# Patient Record
Sex: Female | Born: 1970 | Race: White | Hispanic: No | Marital: Married | Smoking: Never smoker
Health system: Southern US, Community
[De-identification: ages and names within clinical notes are randomized; demographics above are authoritative.]

## PROBLEM LIST (undated history)

## (undated) DIAGNOSIS — G8929 Other chronic pain: Secondary | ICD-10-CM

## (undated) DIAGNOSIS — S0300XA Dislocation of jaw, unspecified side, initial encounter: Secondary | ICD-10-CM

## (undated) DIAGNOSIS — M797 Fibromyalgia: Secondary | ICD-10-CM

## (undated) DIAGNOSIS — M542 Cervicalgia: Secondary | ICD-10-CM

## (undated) HISTORY — PX: TMJ ARTHROPLASTY: SHX1066

---

## 1999-08-30 ENCOUNTER — Other Ambulatory Visit: Admission: RE | Admit: 1999-08-30 | Discharge: 1999-08-30 | Payer: Self-pay | Admitting: Obstetrics & Gynecology

## 1999-09-06 ENCOUNTER — Encounter: Payer: Self-pay | Admitting: Obstetrics & Gynecology

## 1999-09-06 ENCOUNTER — Encounter: Admission: RE | Admit: 1999-09-06 | Discharge: 1999-09-06 | Payer: Self-pay | Admitting: Obstetrics & Gynecology

## 2000-09-04 ENCOUNTER — Other Ambulatory Visit: Admission: RE | Admit: 2000-09-04 | Discharge: 2000-09-04 | Payer: Self-pay | Admitting: Obstetrics & Gynecology

## 2002-07-21 ENCOUNTER — Other Ambulatory Visit: Admission: RE | Admit: 2002-07-21 | Discharge: 2002-07-21 | Payer: Self-pay | Admitting: Obstetrics & Gynecology

## 2015-01-06 ENCOUNTER — Other Ambulatory Visit: Payer: Self-pay | Admitting: Neurology

## 2015-01-06 DIAGNOSIS — G4453 Primary thunderclap headache: Secondary | ICD-10-CM

## 2015-01-06 DIAGNOSIS — R51 Headache: Principal | ICD-10-CM

## 2015-01-06 DIAGNOSIS — R519 Headache, unspecified: Secondary | ICD-10-CM

## 2015-01-17 ENCOUNTER — Ambulatory Visit
Admission: RE | Admit: 2015-01-17 | Discharge: 2015-01-17 | Disposition: A | Discharge status: Home / Self Care | Payer: BLUE CROSS/BLUE SHIELD | Source: Ambulatory Visit | Attending: Neurology | Admitting: Neurology

## 2015-01-17 DIAGNOSIS — R519 Headache, unspecified: Secondary | ICD-10-CM

## 2015-01-17 DIAGNOSIS — R51 Headache: Principal | ICD-10-CM

## 2015-01-17 DIAGNOSIS — G4453 Primary thunderclap headache: Secondary | ICD-10-CM

## 2015-01-17 MED ORDER — GADOBENATE DIMEGLUMINE 529 MG/ML IV SOLN
7.0000 mL | Freq: Once | INTRAVENOUS | Status: AC | PRN
  Administered 2015-01-17: 7 mL via INTRAVENOUS

## 2015-01-21 ENCOUNTER — Other Ambulatory Visit: Payer: Self-pay | Admitting: Neurology

## 2015-01-25 ENCOUNTER — Other Ambulatory Visit: Payer: Self-pay | Admitting: Neurology

## 2015-01-25 DIAGNOSIS — R519 Headache, unspecified: Secondary | ICD-10-CM

## 2015-01-25 DIAGNOSIS — R51 Headache: Principal | ICD-10-CM

## 2015-01-25 DIAGNOSIS — R42 Dizziness and giddiness: Secondary | ICD-10-CM

## 2015-01-28 ENCOUNTER — Ambulatory Visit
Admission: RE | Admit: 2015-01-28 | Discharge: 2015-01-28 | Disposition: A | Discharge status: Home / Self Care | Payer: BLUE CROSS/BLUE SHIELD | Source: Ambulatory Visit | Attending: Neurology | Admitting: Neurology

## 2015-01-28 DIAGNOSIS — R519 Headache, unspecified: Secondary | ICD-10-CM

## 2015-01-28 DIAGNOSIS — R51 Headache: Principal | ICD-10-CM

## 2015-01-28 MED ORDER — IOPAMIDOL (ISOVUE-370) INJECTION 76%
100.0000 mL | Freq: Once | INTRAVENOUS | Status: AC | PRN
  Administered 2015-01-28: 100 mL via INTRAVENOUS

## 2017-06-10 ENCOUNTER — Emergency Department (HOSPITAL_BASED_OUTPATIENT_CLINIC_OR_DEPARTMENT_OTHER): Payer: BLUE CROSS/BLUE SHIELD

## 2017-06-10 ENCOUNTER — Other Ambulatory Visit: Payer: Self-pay

## 2017-06-10 ENCOUNTER — Emergency Department (HOSPITAL_COMMUNITY): Payer: BLUE CROSS/BLUE SHIELD

## 2017-06-10 ENCOUNTER — Emergency Department (HOSPITAL_BASED_OUTPATIENT_CLINIC_OR_DEPARTMENT_OTHER)
Admission: EM | Admit: 2017-06-10 | Discharge: 2017-06-10 | Disposition: A | Discharge status: Home / Self Care | Payer: BLUE CROSS/BLUE SHIELD | Attending: Emergency Medicine | Admitting: Emergency Medicine

## 2017-06-10 ENCOUNTER — Encounter (HOSPITAL_BASED_OUTPATIENT_CLINIC_OR_DEPARTMENT_OTHER): Payer: Self-pay | Admitting: Emergency Medicine

## 2017-06-10 DIAGNOSIS — H538 Other visual disturbances: Secondary | ICD-10-CM | POA: Diagnosis not present

## 2017-06-10 DIAGNOSIS — Z79899 Other long term (current) drug therapy: Secondary | ICD-10-CM | POA: Diagnosis not present

## 2017-06-10 DIAGNOSIS — R42 Dizziness and giddiness: Secondary | ICD-10-CM | POA: Diagnosis present

## 2017-06-10 DIAGNOSIS — M542 Cervicalgia: Secondary | ICD-10-CM | POA: Diagnosis not present

## 2017-06-10 DIAGNOSIS — R51 Headache: Secondary | ICD-10-CM | POA: Diagnosis not present

## 2017-06-10 HISTORY — DX: Other chronic pain: G89.29

## 2017-06-10 HISTORY — DX: Dislocation of jaw, unspecified side, initial encounter: S03.00XA

## 2017-06-10 HISTORY — DX: Fibromyalgia: M79.7

## 2017-06-10 HISTORY — DX: Cervicalgia: M54.2

## 2017-06-10 LAB — CBC WITH DIFFERENTIAL/PLATELET
BASOS PCT: 1 %
Basophils Absolute: 0 10*3/uL (ref 0.0–0.1)
EOS ABS: 0.1 10*3/uL (ref 0.0–0.7)
EOS PCT: 2 %
HCT: 44.6 % (ref 36.0–46.0)
Hemoglobin: 14.9 g/dL (ref 12.0–15.0)
LYMPHS ABS: 2.8 10*3/uL (ref 0.7–4.0)
Lymphocytes Relative: 31 %
MCH: 28.4 pg (ref 26.0–34.0)
MCHC: 33.4 g/dL (ref 30.0–36.0)
MCV: 85.1 fL (ref 78.0–100.0)
MONOS PCT: 9 %
Monocytes Absolute: 0.8 10*3/uL (ref 0.1–1.0)
Neutro Abs: 5.1 10*3/uL (ref 1.7–7.7)
Neutrophils Relative %: 57 %
PLATELETS: 327 10*3/uL (ref 150–400)
RBC: 5.24 MIL/uL — ABNORMAL HIGH (ref 3.87–5.11)
RDW: 13.1 % (ref 11.5–15.5)
WBC: 8.9 10*3/uL (ref 4.0–10.5)

## 2017-06-10 LAB — BASIC METABOLIC PANEL
Anion gap: 5 (ref 5–15)
BUN: 9 mg/dL (ref 6–20)
CALCIUM: 9.1 mg/dL (ref 8.9–10.3)
CHLORIDE: 106 mmol/L (ref 101–111)
CO2: 28 mmol/L (ref 22–32)
CREATININE: 0.71 mg/dL (ref 0.44–1.00)
GFR calc non Af Amer: >60 mL/min (ref >60)
Glucose, Bld: 93 mg/dL (ref 65–99)
Potassium: 4.6 mmol/L (ref 3.5–5.1)
Sodium: 139 mmol/L (ref 135–145)

## 2017-06-10 MED ORDER — DIAZEPAM 5 MG PO TABS
5.0000 mg | ORAL_TABLET | Freq: Once | ORAL | Status: AC
  Administered 2017-06-10: 5 mg via ORAL
  Filled 2017-06-10: qty 1

## 2017-06-10 MED ORDER — MECLIZINE HCL 25 MG PO TABS
25.0000 mg | ORAL_TABLET | Freq: Once | ORAL | Status: AC
  Administered 2017-06-10: 25 mg via ORAL
  Filled 2017-06-10: qty 1

## 2017-06-10 MED ORDER — IOPAMIDOL (ISOVUE-370) INJECTION 76%
100.0000 mL | Freq: Once | INTRAVENOUS | Status: AC | PRN
  Administered 2017-06-10: 100 mL via INTRAVENOUS

## 2017-06-10 NOTE — ED Provider Notes (Signed)
Pt received in transfer [facility]. Pt in stable condition at the time of arrival w/continued dizziness despite meclizine & valium PTA. Normal neurological examination.  Neurology consulted by phone & recommended MRI brain w/o.  UPDATE: MRI w/o intracranial abnormalities.  Explained all results to the Pt & husband. Will discharge the Pt home. Recommending follow-up with Neurology. ED return precautions provided. Pt acknowledged understanding of, and concurrence with the plan. All questions answered to her satisfaction. In stable condition at the time of discharge.   Forest BeckerPetit, Amela Handley, MD 06/10/17 1925    Gerhard MunchLockwood, Robert, MD 06/11/17 0030

## 2017-06-10 NOTE — ED Triage Notes (Signed)
intermittent dizziness, blurry vision and double vision, intermittent since christmas, constant today.  some N/V but none now.

## 2017-06-10 NOTE — ED Notes (Signed)
Patient transported to MRI 

## 2017-06-10 NOTE — ED Notes (Signed)
[facility]

## 2017-06-10 NOTE — ED Notes (Signed)
Report to Shanda BumpsJessica, Consulting civil [facility]charge RN [facility].

## 2017-06-10 NOTE — ED Notes (Signed)
ED Provider at bedside. 

## 2017-06-10 NOTE — ED Provider Notes (Signed)
[facility] EMERGENCY DEPARTMENT Provider Note   CSN: [id number] Arrival date & time: 06/10/17  0836  History   Chief Complaint Chief Complaint  Patient presents with  . Dizziness    HPI Julie Hurst is a 47 y.o. female who presents to the ED complaining of intermittent dizziness since 12/25.  Patient states that she woke up on Christmas and felt dizzy like the room was spinning, typically spinning to the left.  States she got out of bed which acutely worsened the dizziness leading to an episode of vomiting.  States that the dizziness then persisted for 3-4 hours.  Since christmas she has had 5-6 days with similar symptoms including this morning. Dizziness is always in the morning and always worse with movement.  .  Will also have associated lightheadedness at times.  Tried Dramamine a few days ago without any change, has not tried since. She is also having constant pain at the base of the neck since christmas, worse with movement, no injury. Describes the pain as burning. States she has hx of somewhat similar that was much milder- two less severe episodes in summer of 2018- was informed it was an equilibrium issue and given epley maneuver exercises. Also of note having blurry vision in bilateral eyes, L > R, for the past 4 months that is constant, worsened with dizziness. Denies numbness or weakness. Has had chronic unchanged headaches, none at present.   HPI  Past Medical History:  Diagnosis Date  . Chronic neck pain   . Fibromyalgia   . TMJ (dislocation of temporomandibular joint)     There are no active problems to display for this patient.   Past Surgical History:  Procedure Laterality Date  . TMJ ARTHROPLASTY      OB History    No data available       Home Medications    Prior to Admission medications   Medication Sig Start Date End Date Taking? Authorizing Provider  cholecalciferol (VITAMIN D) 1000 units tablet Take 2,000 Units by mouth daily.   Yes  [provider]  venlafaxine XR (EFFEXOR-XR) 75 MG 24 hr capsule Take 75 mg by mouth daily with breakfast.   Yes [provider]    Family History History reviewed. No pertinent family history.  Social History Social History   Tobacco Use  . Smoking status: Not on file  Substance Use Topics  . Alcohol use: Not on file  . Drug use: Not on file     Allergies   Patient has no known allergies.   Review of Systems Review of Systems  Constitutional: Negative for fever.  HENT: Positive for tinnitus (intermittent, chronic unchanged ). Negative for congestion, ear pain, hearing loss and rhinorrhea.   Eyes: Positive for visual disturbance (constant x4 months. ).  Respiratory: Negative for shortness of breath.   Cardiovascular: Negative for palpitations.  Gastrointestinal: Positive for nausea and vomiting (not today). Negative for abdominal pain, constipation and diarrhea.  Genitourinary: Negative for dysuria.  Musculoskeletal: Positive for neck pain.  Neurological: Positive for dizziness, light-headedness and headaches (chronic unchanged). Negative for weakness and numbness.  All other systems reviewed and are negative.    Physical Exam Updated Vital Signs BP (!) 141/102 (BP Location: Left Arm)   Pulse 100   Temp 98.4 F (36.9 C) (Oral)   Resp 14   Ht 5\' 4"  (1.626 m)   Wt 70.3 kg (155 lb)   LMP 05/30/2017   SpO2 99%   BMI 26.61 kg/m  Physical Exam  Constitutional: She appears well-developed and well-nourished. No distress.  HENT:  Head: Normocephalic and atraumatic.  Mouth/Throat: Uvula is midline and oropharynx is clear and moist.  Eyes: Conjunctivae and EOM are normal. Pupils are equal, round, and reactive to light. Right eye exhibits no discharge. Left eye exhibits no discharge. Right eye exhibits no nystagmus. Left eye exhibits no nystagmus.  Neck: Spinous process tenderness (diffuse) present. No muscular tenderness present.  Cardiovascular:  Normal rate and regular rhythm.  No murmur heard. Pulmonary/Chest: Breath sounds normal. No respiratory distress. She has no wheezes. She has no rales.  Abdominal: Soft. She exhibits no distension. There is no tenderness.  Neurological: She is alert.  Clear speech. No facial droop. CNIII-XII are intact. Bilateral upper and lower extremities' sensation intact to sharp and dull touch. 5/5 grip strength bilaterally. 5/5 plantar and dorsi flexion bilaterally. Gait intact. Negative pronator drift. Negative HINTS exam.   Skin: Skin is warm and dry. No rash noted.  Psychiatric: She has a normal mood and affect. Her behavior is normal.  Nursing note and vitals reviewed.   ED Treatments / Results  Labs Results for orders placed or performed during the hospital encounter of 06/10/17  Basic metabolic panel  Result Value Ref Range   Sodium 139 135 - 145 mmol/L   Potassium 4.6 3.5 - 5.1 mmol/L   Chloride 106 101 - 111 mmol/L   CO2 28 22 - 32 mmol/L   Glucose, Bld 93 65 - 99 mg/dL   BUN 9 6 - 20 mg/dL   Creatinine, Ser 1.61 0.44 - 1.00 mg/dL   Calcium 9.1 8.9 - 09.6 mg/dL   GFR calc non Af Amer >60 >60 mL/min   GFR calc Af Amer >60 >60 mL/min   Anion gap 5 5 - 15  CBC with Differential  Result Value Ref Range   WBC 8.9 4.0 - 10.5 K/uL   RBC 5.24 (H) 3.87 - 5.11 MIL/uL   Hemoglobin 14.9 12.0 - 15.0 g/dL   HCT 04.5 40.9 - 81.1 %   MCV 85.1 78.0 - 100.0 fL   MCH 28.4 26.0 - 34.0 pg   MCHC 33.4 30.0 - 36.0 g/dL   RDW 91.4 78.2 - 95.6 %   Platelets 327 150 - 400 K/uL   Neutrophils Relative % 57 %   Neutro Abs 5.1 1.7 - 7.7 K/uL   Lymphocytes Relative 31 %   Lymphs Abs 2.8 0.7 - 4.0 K/uL   Monocytes Relative 9 %   Monocytes Absolute 0.8 0.1 - 1.0 K/uL   Eosinophils Relative 2 %   Eosinophils Absolute 0.1 0.0 - 0.7 K/uL   Basophils Relative 1 %   Basophils Absolute 0.0 0.0 - 0.1 K/uL    EKG  EKG Interpretation None      Radiology Ct Angio Head W Or Wo Contrast  Result Date:  06/10/2017 CLINICAL DATA:  Vertigo. Intermittent dizziness, blurred vision, and diplopia since 05/28/2017, worse today. Posterior neck pain. EXAM: CT ANGIOGRAPHY HEAD AND NECK TECHNIQUE: Multidetector CT imaging of the head and neck was performed using the standard protocol during bolus administration of intravenous contrast. Multiplanar CT image reconstructions and MIPs were obtained to evaluate the vascular anatomy. Carotid stenosis measurements (when applicable) are obtained utilizing NASCET criteria, using the distal internal carotid diameter as the denominator. CONTRAST:  ISOVUE-370 IOPAMIDOL (ISOVUE-370) INJECTION 76% COMPARISON:  Head CT 01/28/2015.  Brain MRI 01/17/2015. FINDINGS: CT HEAD FINDINGS Brain: There is no evidence of acute infarct, intracranial hemorrhage,  mass, midline shift, or extra-axial fluid collection. The ventricles and sulci are normal. Vascular: No hyperdense vessel. Skull: No fracture or focal osseous lesion. Sinuses: Visualized paranasal sinuses and mastoid air cells are clear. Orbits: Unremarkable. Review of the MIP images confirms the above findings CTA NECK FINDINGS Aortic arch: Normal variant aortic arch branching with common origin of the brachiocephalic and left common carotid arteries. Widely patent arch vessel origins. Right carotid system: Patent without evidence of dissection, stenosis, or significant atherosclerosis. Left carotid system: Patent without evidence of dissection, stenosis, or significant atherosclerosis. Vertebral arteries: Patent and codominant without evidence of dissection, stenosis, or significant atherosclerosis. Skeleton: Mild cervical spondylosis. Other neck: No mass or lymph node enlargement. Upper chest: Clear lung apices. Review of the MIP images confirms the above findings CTA HEAD FINDINGS Anterior circulation: The internal carotid arteries are widely patent from skullbase to carotid termini. A 1.5 mm outpouching from the left supraclinoid ICA  is unchanged from the prior CTA. A small vessel courses passed this outpouching but appears to be a vein. A left posterior communicating artery is not clearly identified on this study. ACAs and MCAs are patent without evidence of proximal branch occlusion or significant proximal stenosis. Posterior circulation: The intracranial vertebral arteries are widely patent to the basilar. Patent PICA and SCA origins are visualized bilaterally. The basilar artery is widely patent. There is a small right posterior communicating artery. The PCAs are patent without evidence of significant stenosis. No aneurysm. Venous sinuses: Patent. Anatomic variants: None. Delayed phase: No abnormal enhancement. Review of the MIP images confirms the above findings IMPRESSION: 1. Unchanged 1.5 mm outpouching from the left supraclinoid ICA which may represent an infundibulum or tiny aneurysm. 2. Otherwise negative head and neck CTA. Electronically Signed   By: Sebastian AcheAllen  Grady M.D.   On: 06/10/2017 12:15   Ct Angio Neck W And/or Wo Contrast  Result Date: 06/10/2017 CLINICAL DATA:  Vertigo. Intermittent dizziness, blurred vision, and diplopia since 05/28/2017, worse today. Posterior neck pain. EXAM: CT ANGIOGRAPHY HEAD AND NECK TECHNIQUE: Multidetector CT imaging of the head and neck was performed using the standard protocol during bolus administration of intravenous contrast. Multiplanar CT image reconstructions and MIPs were obtained to evaluate the vascular anatomy. Carotid stenosis measurements (when applicable) are obtained utilizing NASCET criteria, using the distal internal carotid diameter as the denominator. CONTRAST:  100mL ISOVUE-370 IOPAMIDOL (ISOVUE-370) INJECTION 76% COMPARISON:  Head CT 01/28/2015.  Brain MRI 01/17/2015. FINDINGS: CT HEAD FINDINGS Brain: There is no evidence of acute infarct, intracranial hemorrhage, mass, midline shift, or extra-axial fluid collection. The ventricles and sulci are normal. Vascular: No hyperdense  vessel. Skull: No fracture or focal osseous lesion. Sinuses: Visualized paranasal sinuses and mastoid air cells are clear. Orbits: Unremarkable. Review of the MIP images confirms the above findings CTA NECK FINDINGS Aortic arch: Normal variant aortic arch branching with common origin of the brachiocephalic and left common carotid arteries. Widely patent arch vessel origins. Right carotid system: Patent without evidence of dissection, stenosis, or significant atherosclerosis. Left carotid system: Patent without evidence of dissection, stenosis, or significant atherosclerosis. Vertebral arteries: Patent and codominant without evidence of dissection, stenosis, or significant atherosclerosis. Skeleton: Mild cervical spondylosis. Other neck: No mass or lymph node enlargement. Upper chest: Clear lung apices. Review of the MIP images confirms the above findings CTA HEAD FINDINGS Anterior circulation: The internal carotid arteries are widely patent from skullbase to carotid termini. A 1.5 mm outpouching from the left supraclinoid ICA is unchanged from the prior CTA. A  small vessel courses passed this outpouching but appears to be a vein. A left posterior communicating artery is not clearly identified on this study. ACAs and MCAs are patent without evidence of proximal branch occlusion or significant proximal stenosis. Posterior circulation: The intracranial vertebral arteries are widely patent to the basilar. Patent PICA and SCA origins are visualized bilaterally. The basilar artery is widely patent. There is a small right posterior communicating artery. The PCAs are patent without evidence of significant stenosis. No aneurysm. Venous sinuses: Patent. Anatomic variants: None. Delayed phase: No abnormal enhancement. Review of the MIP images confirms the above findings IMPRESSION: 1. Unchanged 1.5 mm outpouching from the left supraclinoid ICA which may represent an infundibulum or tiny aneurysm. 2. Otherwise negative head and  neck CTA. Electronically Signed   By: Sebastian Ache M.D.   On: 06/10/2017 12:15    Procedures Procedures (including critical care time)  Medications Ordered in ED Medications  meclizine (ANTIVERT) tablet 25 mg (25 mg Oral Given 06/10/17 1104)  diazepam (VALIUM) tablet 5 mg (5 mg Oral Given 06/10/17 1157)  iopamidol (ISOVUE-370) 76 % injection 100 mL (100 mLs Intravenous Contrast Given 06/10/17 1135)     Initial Impression / Assessment and Plan / ED Course  I have reviewed the triage vital signs and the nursing notes.  Pertinent labs & imaging results that were available during my care of the patient were reviewed by me and considered in my medical decision making (see chart for details).   Patient presents with intermittent dizziness as well as constant neck pain since 12/25.  She is nontoxic-appearing, vitals within normal limits other than blood pressure initially elevated, this is improved.  Patient without deficits on neurologic exam, dizziness is reproducible with turning of the head.  Given array of symptoms including dizziness neck pain, and change in vision, considering dissection and malignancy.  Will consult tele-neurology service to discuss imaging selection.   11:17: CONSULT: Discussed case with neurologist Dr. Juliane Poot of tele neurology service who recommends CTA head and neck.   Will obtain head and neck CT as well as basic lab work.  Will treat dizziness with meclizine.    Dizziness without change following Meclizine will give Valium.   CT scan results with an unchanged 1.5 mm outpouching from the left supraclinoid ICA which may represent an infundibulum or tiny aneurysm, otherwise negative. This was present on prior CTA in 2016.   12:45: RE-EVAL: Discussed CT scan results with patient and her husband. Dizziness is unchanged following Meclizine and Valium.   Findings and plan of care discussed with supervising physician Dr. Rush Landmark- discussed given CTA as well as persistent  dizziness following treatments, concerned the patient will require MRI, will consult neurologist.  13:05: CONSULT: Discussed case with neurologist Dr. Amada Jupiter who recommends transfer for MRI  13:47: CONSULT: Discussed case with Dr. Cathi Roan, emergency medicine physician, who is aware of patient and accepts transfer.    Final Clinical Impressions(s) / ED Diagnoses   Final diagnoses:  Dizziness   ED Discharge Orders    None       Cherly Anderson, PA-C 06/10/17 1803    Tegeler, Canary Brim, MD 06/10/17 2000

## 2017-06-10 NOTE — ED Notes (Signed)
Pt sts dizziness has improved somewhat, but is still there.

## 2017-06-13 ENCOUNTER — Encounter: Payer: Self-pay | Admitting: Neurology

## 2017-06-13 ENCOUNTER — Ambulatory Visit (INDEPENDENT_AMBULATORY_CARE_PROVIDER_SITE_OTHER): Payer: BLUE CROSS/BLUE SHIELD | Admitting: Neurology

## 2017-06-13 VITALS — BP 120/86 | HR 68 | Ht 64.0 in | Wt 156.0 lb

## 2017-06-13 DIAGNOSIS — H8110 Benign paroxysmal vertigo, unspecified ear: Secondary | ICD-10-CM | POA: Diagnosis not present

## 2017-06-13 NOTE — Progress Notes (Signed)
Subjective:    Patient ID: Julie Hurst is a 47 y.o. female.  HPI    Huston Foley, MD, PhD [facility] [address], [address] [address] [place], [place] [address]  I saw Ms. Particia Hurst as a referral from the emergency room for dizziness. The patient is unaccompanied today.  She is a 47 year old right-handed woman with an underlying medical history of fibromyalgia, borderline overweight state, vitamin D deficiency, TMJ issues and neck pain, who presented to the emergency room on 06/10/2017 with a one-week history of dizziness including spinning sensation. She took over-the-counter medication without success. In the emergency room she had head CT without contrast and also subsequent brain MRI without contrast. She was treated symptomatically with meclizine and Valium. I reviewed the emergency room records. She had a head and neck CT angiogram on 06/10/2017 which I reviewed: IMPRESSION: 1. Unchanged 1.5 mm outpouching from the left supraclinoid ICA which may represent an infundibulum or tiny aneurysm. 2. Otherwise negative head and neck CTA. Brain MRI without contrast on 06/10/2017 showed: IMPRESSION: 1. No intracranial abnormality. 2. Small right mastoid effusion. She is a nonsmoker and drinks alcohol occasionally. She drinks caffeine in the form soda, about 100 oz per day per self report. She does not drink water very much. She works for a [employer]. She is married and lives with her husband.  She had a brain MRI/MRA on 01/17/15, which I reviewed: IMPRESSION: Negative MRI brain with special attention to the pituitary.   Negative MRA intracranial.  No focal stenosis or visible aneurysm.  She reports that she had a history of headaches in the past. She started seeing Dr. Vela Prose for this for concern for migraines but was diagnosed with tension headaches. She does not have any migrainous headaches. She feels improved. When she first started having symptoms  around Christmas day she had severe room spinning sensation, associated with nausea and vomiting. Her symptoms fluctuated after that, she improved slowly but had exacerbation of her symptoms around 06/10/2017 when she finally presented to the emergency room. She feels that the meclizine has not helped. She has not had any recent vomiting. She denies any recurrent headache at this time.   Her Past Medical History Is Significant For: Past Medical History:  Diagnosis Date  . Chronic neck pain   . Fibromyalgia   . TMJ (dislocation of temporomandibular joint)     His Past Surgical History Is Significant For: Past Surgical History:  Procedure Laterality Date  . TMJ ARTHROPLASTY      Her Family History Is Significant For: No family history on file.  Her Social History Is Significant For: Social History   Socioeconomic History  . Marital status: Married    Spouse name: None  . Number of children: None  . Years of education: None  . Highest education level: None  Social Needs  . Financial resource strain: None  . Food insecurity - worry: None  . Food insecurity - inability: None  . Transportation needs - medical: None  . Transportation needs - non-medical: None  Occupational History  . None  Tobacco Use  . Smoking status: Never Smoker  . Smokeless tobacco: Never Used  Substance and Sexual Activity  . Alcohol use: None  . Drug use: None  . Sexual activity: None  Other Topics Concern  . None  Social History Narrative  . None    Her Allergies Are:  Allergies  Allergen Reactions  . Penicillins Rash    Has patient  had a PCN reaction causing immediate rash, facial/tongue/throat swelling, SOB or lightheadedness with hypotension: Yes Has patient had a PCN reaction causing severe rash involving mucus membranes or skin necrosis: Unk Has patient had a PCN reaction that required hospitalization: No Has patient had a PCN reaction occurring within the last 10 years: No If all of the  above answers are "NO", then may proceed with Cephalosporin use.   . Calamine Rash  . Other Itching and Rash    ALL TREE NUTS- Itchy throat (also)  . Pecan Nut (Diagnostic) Rash and Other (See Comments)    Itchy throat (also)  :   Her Current Medications Are:  Outpatient Encounter Medications as of 06/13/2017  Medication Sig  . Cholecalciferol (VITAMIN D-3) 1000 units CAPS Take 2,000 Units by mouth daily.  . clobetasol ointment (TEMOVATE) 0.05 % Apply 1 application topically 2 (two) times daily as needed (for rashes or poison ivy).   . venlafaxine XR (EFFEXOR-XR) 75 MG 24 hr capsule Take 75 mg by mouth daily with breakfast.   No facility-administered encounter medications on file as of 06/13/2017.   :   Review of Systems:  Out of a complete 14 point review of systems, all are reviewed and negative with the exception of these symptoms as listed below:  Review of Systems  Neurological:       Pt presents today to discuss her vertigo. Pt has been suffering from vertigo since Christmas. Pt had an ER visit this week but the medications did not seem to alleviate her symptoms.    Objective:  Neurological Exam  Physical Exam   Physical Examination:   Vitals:   06/13/17 1456  BP: 120/86  Pulse: 68   She has no significant orthostatic symptoms or blood pressure and pulse changes.  She has no vertigo symptoms upon sudden changes in body position or head position.  General Examination: The patient is a very pleasant 47 y.o. female in no acute distress. She appears well-developed and well-nourished and well groomed.   HEENT: Normocephalic, atraumatic, pupils are equal, round and reactive to light and accommodation. Funduscopic exam is normal with sharp disc margins noted. Extraocular tracking is good without limitation to gaze excursion or nystagmus noted. Normal smooth pursuit is noted. Hearing is grossly intact. Tympanic membranes are clear bilaterally. Face is symmetric with normal  facial animation and normal facial sensation. Speech is clear with no dysarthria noted. There is no hypophonia. There is no lip, neck/head, jaw or voice tremor. Neck is supple with full range of passive and active motion. There are no carotid bruits on auscultation. Oropharynx exam reveals: mild mouth dryness. Tongue protrudes centrally and palate elevates symmetrically.   Chest: Clear to auscultation without wheezing, rhonchi or crackles noted.  Heart: S1+S2+0, regular and normal without murmurs, rubs or gallops noted.   Abdomen: Soft, non-tender and non-distended with normal bowel sounds appreciated on auscultation.  Extremities: There is no pitting edema in the distal lower extremities bilaterally. Pedal pulses are intact.  Skin: Warm and dry without trophic changes noted.  Musculoskeletal: exam reveals no obvious joint deformities, tenderness or joint swelling or erythema.   Neurologically:  Mental status: The patient is awake, alert and oriented in all 4 spheres. Her immediate and remote memory, attention, language skills and fund of knowledge are appropriate. There is no evidence of aphasia, agnosia, apraxia or anomia. Speech is clear with normal prosody and enunciation. Thought process is linear. Mood is normal and affect is normal.  Cranial nerves II -  XII are as described above under HEENT exam. In addition: shoulder shrug is normal with equal shoulder height noted. Motor exam: Normal bulk, strength and tone is noted. There is no drift, tremor or rebound. Romberg is negative. Reflexes are 2+ throughout. Babinski: Toes are flexor bilaterally. Fine motor skills and coordination: intact with normal finger taps, normal hand movements, normal rapid alternating patting, normal foot taps and normal foot agility.  Cerebellar testing: No dysmetria or intention tremor on finger to nose testing. Heel to shin is unremarkable bilaterally. There is no truncal or gait ataxia.  Sensory exam: intact to  light touch in the upper and lower extremities.  Gait, station and balance: She stands easily. No veering to one side is noted. No leaning to one side is noted. Posture is age-appropriate and stance is narrow based. Gait shows normal stride length and normal pace. No problems turning are noted. He turns en bloc, tandem walk is unremarkable.   Assessment and Plan:    In summary, Amazin Eagleton is a very pleasant 47 y.o.-year old female with an underlying medical history of fibromyalgia, borderline overweight state, vitamin D deficiency, TMJ issues and neck pain, who presents for neurologic consultation for recent onset of positional vertigo. Most likely she has benign positional vertigo. She may have had a smaller about last year in the summer. Neurologically, she has a nonfocal exam, no evidence of orthostatic hypotension either. She had a recent brain MRI in the emergency room which showed stable and benign findings. She is reassured in that regard. She may benefit from seeing ENT for evaluation. Furthermore, she may benefit from physical therapy with vestibular rehabilitation. She is encouraged to talk with her primary care physician about these referrals. Thankfully she has improved. She is advised to stay better hydrated with water reduce her caffeine/soda intake. At this juncture I can see her back on an as-needed basis. I answered all her questions today and she was in agreement.   Huston Foley, MD, PhD

## 2017-06-13 NOTE — Patient Instructions (Signed)
Your neurological exam is nonfocal which is reassuring, he had a benign appearing MRI recently in the emergency room. For persistent vertigo symptoms I would recommend you seek consultation with ENT. Please talk to primary care physician about seeing an ENT (ear nose throat) specialist. Ultimately, for ongoing vertigo symptoms physical therapy can be very helpful. Please talk to primary care provider about a referral to physical therapy for vestibular rehabilitation. I can see you back as needed.

## 2019-06-04 IMAGING — CT CT ANGIO NECK
1 of 14 series · 5 of 33 positions shown · IV contrast (iopamidol)
Comparison: Head CT 01/28/2015.  Brain MRI 01/17/2015.

CLINICAL DATA: Vertigo. Intermittent dizziness, blurred vision, and
diplopia since 05/28/2017, worse today. Posterior neck pain.

EXAM:
CT ANGIOGRAPHY HEAD AND NECK
TECHNIQUE: Multidetector CT imaging of the head and neck was performed using
the standard protocol during bolus administration of intravenous
contrast. Multiplanar CT image reconstructions and MIPs were
obtained to evaluate the vascular anatomy. Carotid stenosis
measurements (when applicable) are obtained utilizing NASCET
criteria, using the distal internal carotid diameter as the
denominator.
CONTRAST:  100mL 4QBP7F-JO7 IOPAMIDOL ([id number]-JO7) INJECTION 76%

[Series 12: axial thin · axial · 0.42mm/px · z∈[-345,-113]mm · 5 of 368 slices shown]
[im 62/368  soft-tissue]
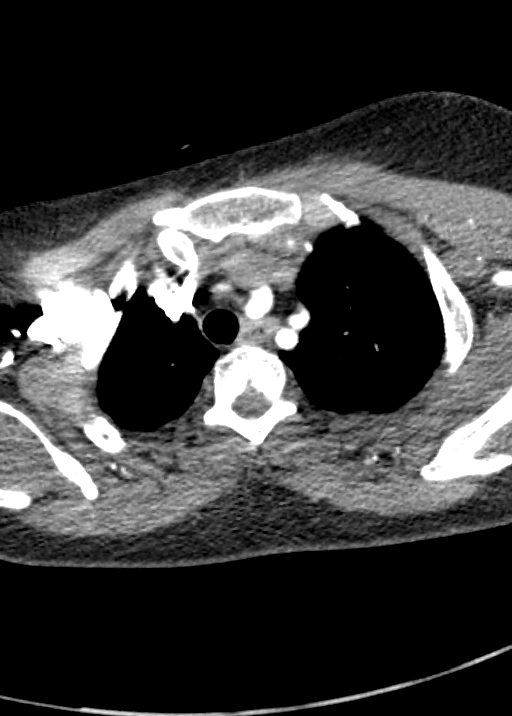
[im 123/368  bone]
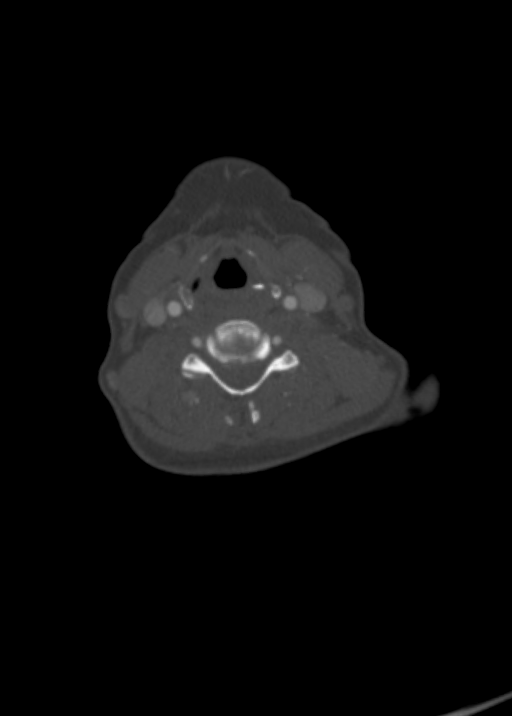
[im 184/368  soft-tissue]
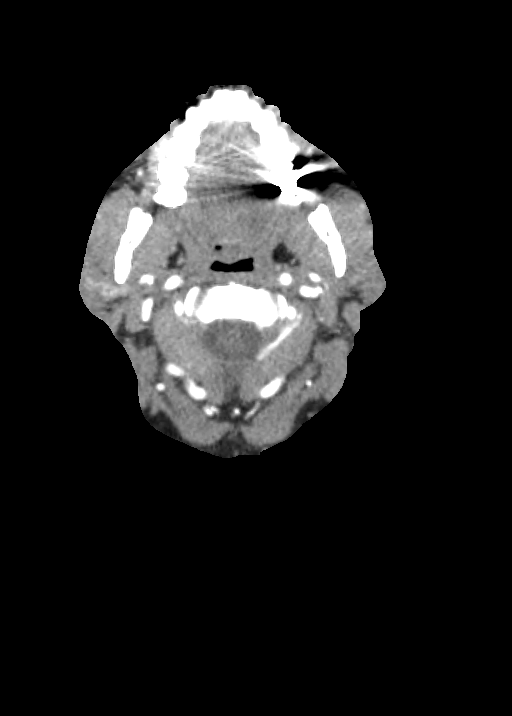
[im 245/368  bone]
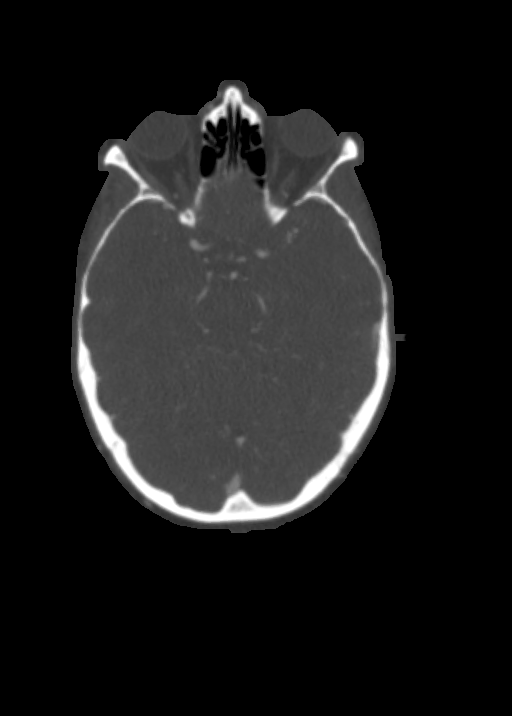
[im 306/368  soft-tissue]
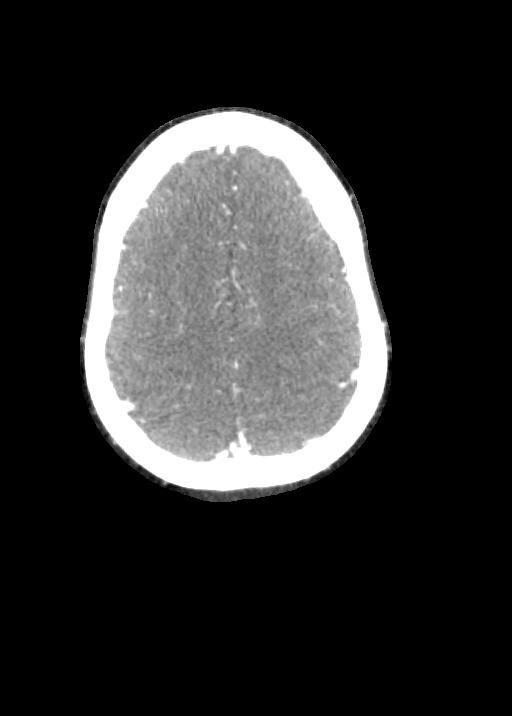

[5 of 33 positions shown; findings below may reference images not displayed]

FINDINGS: CT HEAD FINDINGS

Brain: There is no evidence of acute infarct, intracranial
hemorrhage, mass, midline shift, or extra-axial fluid collection.
The ventricles and sulci are normal.

Vascular: No hyperdense vessel.

Skull: No fracture or focal osseous lesion.

Sinuses: Visualized paranasal sinuses and mastoid air cells are
clear.

Orbits: Unremarkable.

Review of the MIP images confirms the above findings

CTA NECK FINDINGS

Aortic arch: Normal variant aortic arch branching with common origin
of the brachiocephalic and left common carotid arteries. Widely
patent arch vessel origins.

Right carotid system: Patent without evidence of dissection,
stenosis, or significant atherosclerosis.

Left carotid system: Patent without evidence of dissection,
stenosis, or significant atherosclerosis.

Vertebral arteries: Patent and codominant without evidence of
dissection, stenosis, or significant atherosclerosis.

Skeleton: Mild cervical spondylosis.

Other neck: No mass or lymph node enlargement.

Upper chest: Clear lung apices.

Review of the MIP images confirms the above findings

CTA HEAD FINDINGS

Anterior circulation: The internal carotid arteries are widely
patent from skullbase to carotid termini. A 1.5 mm outpouching from
the left supraclinoid ICA is unchanged from the prior CTA. A small
vessel courses passed this outpouching but appears to be a vein. A
left posterior communicating artery is not clearly identified on
this study. ACAs and MCAs are patent without evidence of proximal
branch occlusion or significant proximal stenosis.

Posterior circulation: The intracranial vertebral arteries are
widely patent to the basilar. Patent PICA and SCA origins are
visualized bilaterally. The basilar artery is widely patent. There
is a small right posterior communicating artery. The PCAs are patent
without evidence of significant stenosis. No aneurysm.

Venous sinuses: Patent.

Anatomic variants: None.

Delayed phase: No abnormal enhancement.

Review of the MIP images confirms the above findings
IMPRESSION: 1. Unchanged 1.5 mm outpouching from the left supraclinoid ICA which
may represent an infundibulum or tiny aneurysm.
2. Otherwise negative head and neck CTA.

## 2019-06-04 IMAGING — MR MR HEAD W/O CM
9 of 10 series · 38 of 48 positions shown · non-contrast
Comparison: CTA head neck 06/10/2017

CLINICAL DATA: Central vertigo

EXAM:
MRI HEAD WITHOUT CONTRAST
TECHNIQUE: Multiplanar, multiecho pulse sequences of the brain and surrounding
structures were obtained without intravenous contrast.

[Series 3: DWI · axial · 3.0mm · 1.09mm/px · z∈[-100,+56]mm · 11 of 106 slices shown (1 of 4)]
[im 1/106]
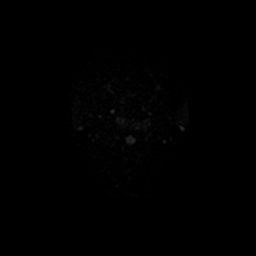
[im 11/106]
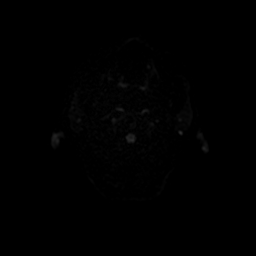
[im 22/106]
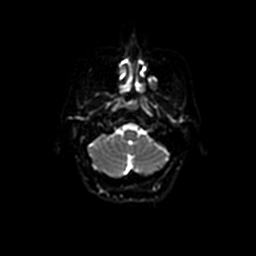
[im 32/106]
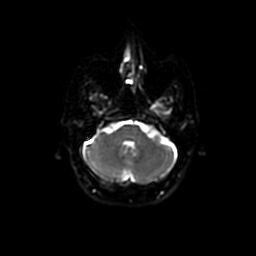
[im 43/106]
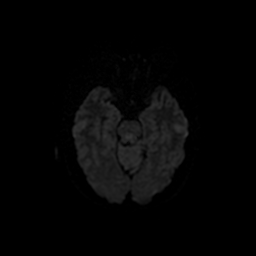
[im 53/106]
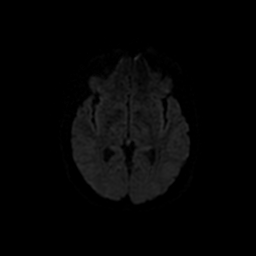
[im 64/106]
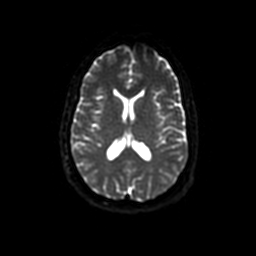
[im 74/106]
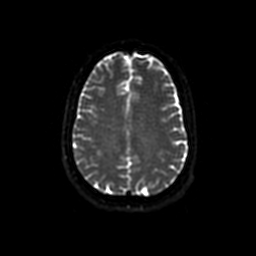
[im 85/106]
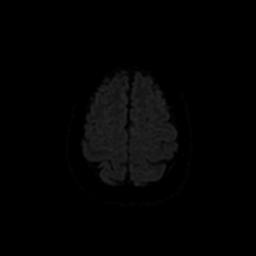
[im 95/106]
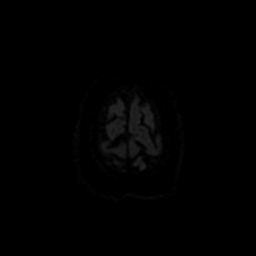
[im 106/106]
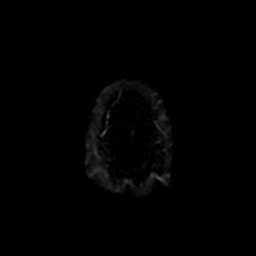

[Series 4: DWI · coronal · 5.0mm · 1.09mm/px · 8 of 76 slices shown (2 of 4)]
[im 1/76]
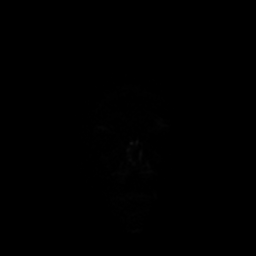
[im 11/76]
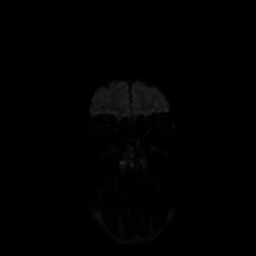
[im 22/76]
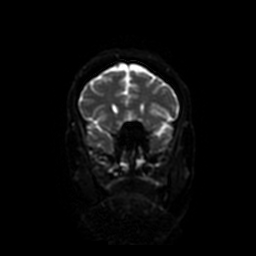
[im 33/76]
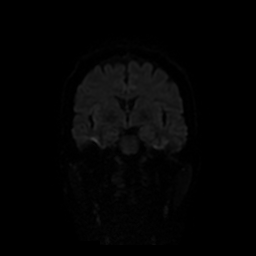
[im 43/76]
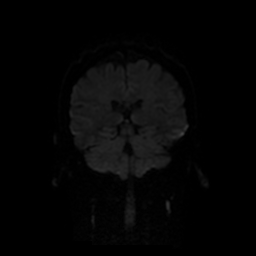
[im 54/76]
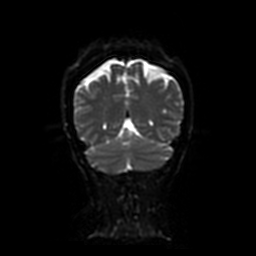
[im 65/76]
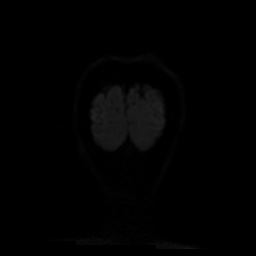
[im 76/76]
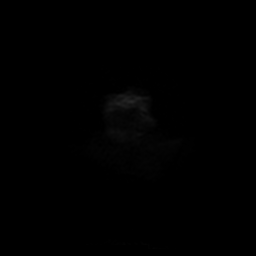

[Series 5: T1 · sagittal · 5.0mm · 0.47mm/px · 2 of 23 slices shown]
[im 1/23]
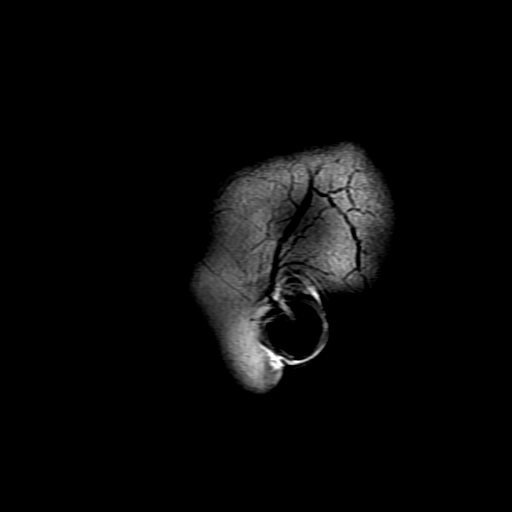
[im 23/23]
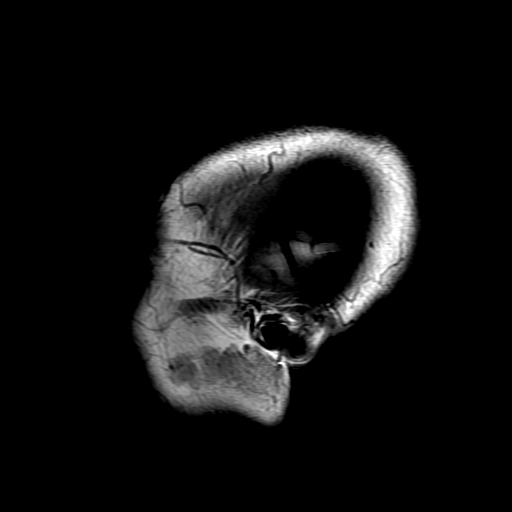

[Series 6: T2 · axial · 5.0mm · 0.43mm/px · z∈[-90,+54]mm · 2 of 25 slices shown (1 of 2)]
[im 1/25]
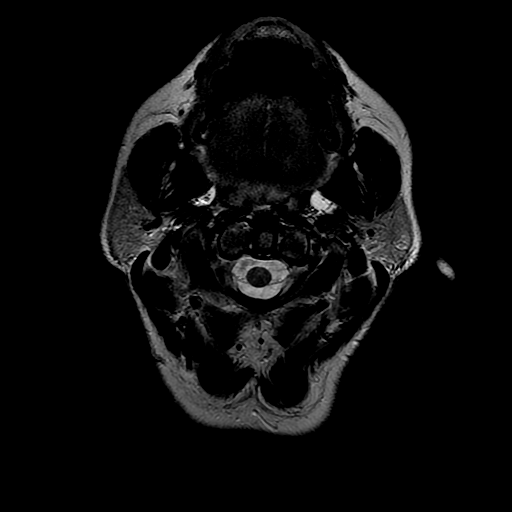
[im 25/25]
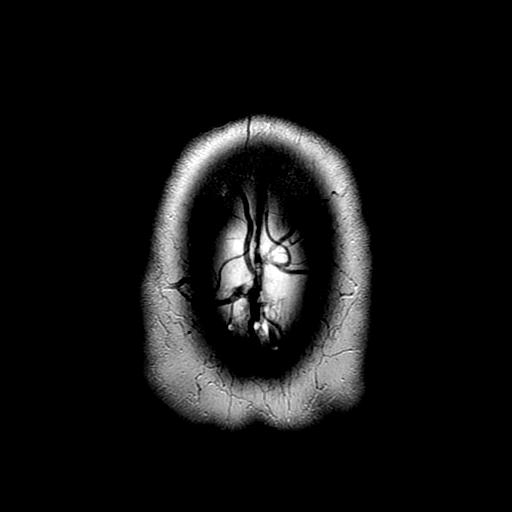

[Series 7: ax mpgr · axial · 5.0mm · 0.43mm/px · z∈[-92,+55]mm · 2 of 22 slices shown]
[im 1/22]
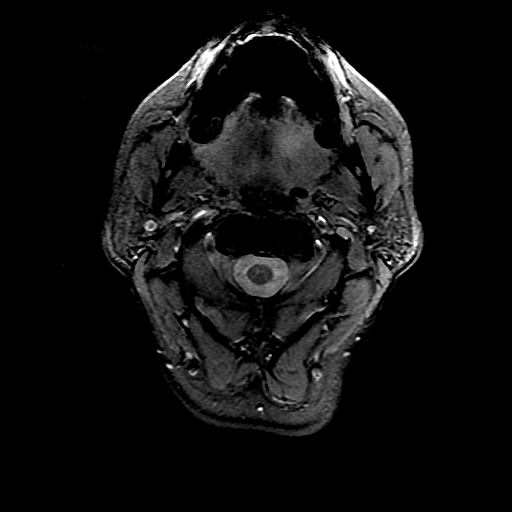
[im 22/22]
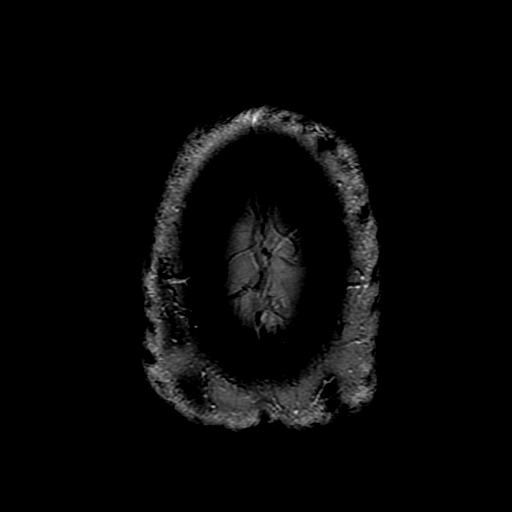

[Series 8: FLAIR · axial · 5.0mm · 0.43mm/px · z∈[-82,+56]mm · 2 of 24 slices shown]
[im 1/24]
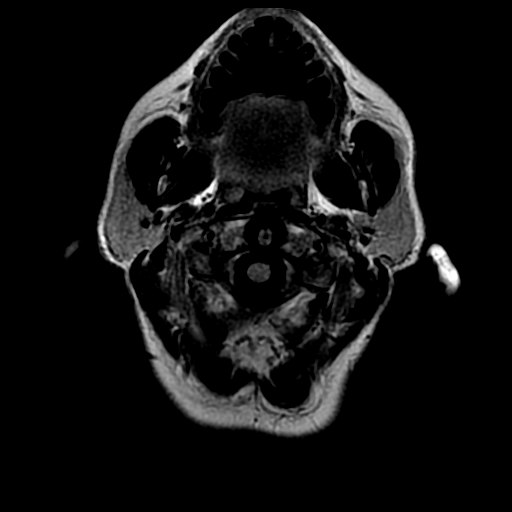
[im 24/24]
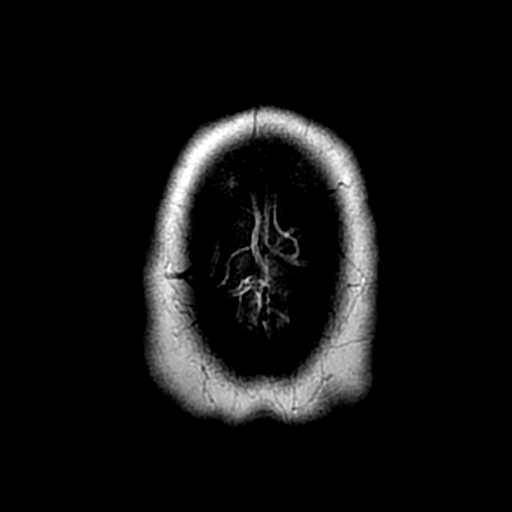

[Series 10: T2 · coronal · 5.0mm · 0.43mm/px · 2 of 25 slices shown (2 of 2)]
[im 1/25]
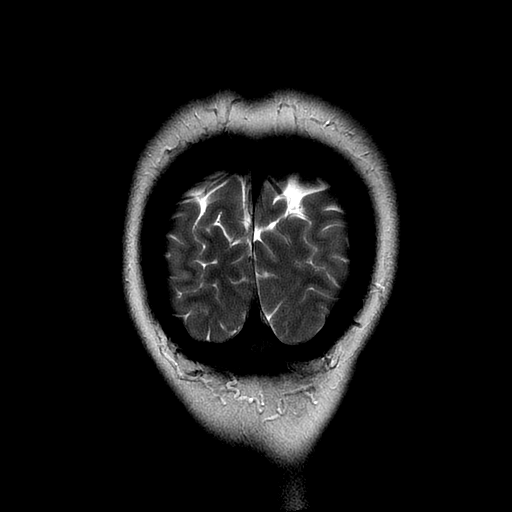
[im 25/25]
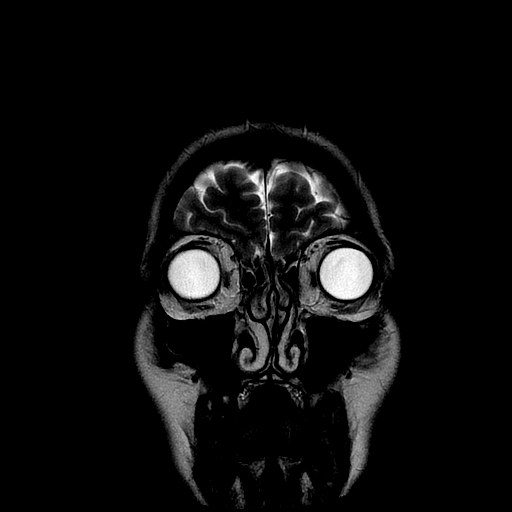

[Series 300: DWI · axial · 3.0mm · 1.09mm/px · z∈[-100,+56]mm · 5 of 53 slices shown (3 of 4)]
[im 1/53]
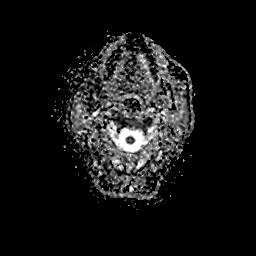
[im 14/53]
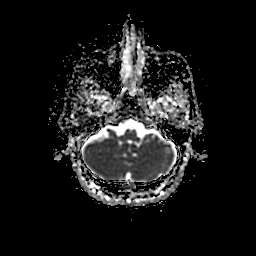
[im 27/53]
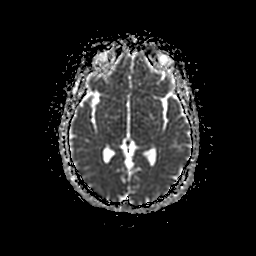
[im 40/53]
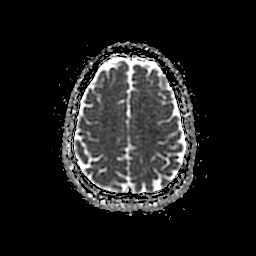
[im 53/53]
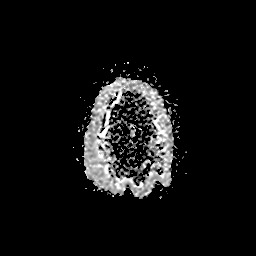

[Series 400: DWI · coronal · 5.0mm · 1.09mm/px · 4 of 38 slices shown (4 of 4)]
[im 1/38]
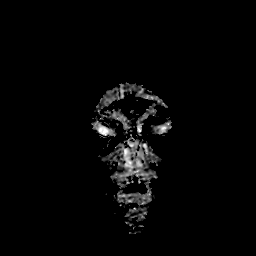
[im 13/38]
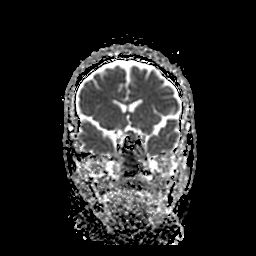
[im 25/38]
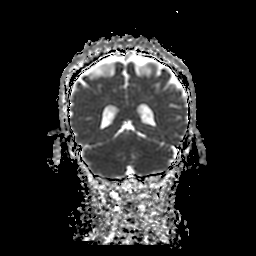
[im 38/38]
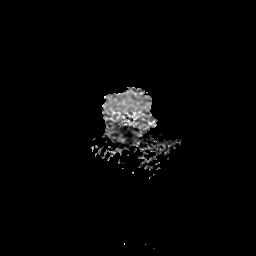

[38 of 48 positions shown; findings below may reference images not displayed]

FINDINGS: Brain: The midline structures are normal. There is no acute infarct
or acute hemorrhage. No mass lesion, hydrocephalus, dural
abnormality or extra-axial collection. The brain parenchymal signal
is normal. No age-advanced or lobar predominant atrophy. No chronic
microhemorrhage or superficial siderosis.

Vascular: Major intracranial arterial and venous sinus flow voids
are preserved.

Skull and upper cervical spine: The visualized skull base,
calvarium, upper cervical spine and extracranial soft tissues are
normal.

Sinuses/Orbits: There is a small left maxillary sinus retention
cyst. Small right mastoid effusion. Normal visualized middle and
inner ear structures. Normal orbits.
IMPRESSION: 1. No intracranial abnormality.
2. Small right mastoid effusion.
